# Patient Record
Sex: Male | Born: 1960 | Race: Black or African American | Hispanic: No | Smoking: Never smoker
Health system: Southern US, Community
[De-identification: ages and names within clinical notes are randomized; demographics above are authoritative.]

## PROBLEM LIST (undated history)

## (undated) DIAGNOSIS — R51 Headache: Secondary | ICD-10-CM

## (undated) DIAGNOSIS — F329 Major depressive disorder, single episode, unspecified: Secondary | ICD-10-CM

## (undated) DIAGNOSIS — K219 Gastro-esophageal reflux disease without esophagitis: Secondary | ICD-10-CM

## (undated) DIAGNOSIS — M1990 Unspecified osteoarthritis, unspecified site: Secondary | ICD-10-CM

## (undated) DIAGNOSIS — F32A Depression, unspecified: Secondary | ICD-10-CM

## (undated) HISTORY — PX: COLONOSCOPY W/ BIOPSIES AND POLYPECTOMY: SHX1376

## (undated) HISTORY — PX: KNEE ARTHROSCOPY: SUR90

## (undated) HISTORY — PX: HERNIA REPAIR: SHX51

---

## 2014-05-02 ENCOUNTER — Encounter (HOSPITAL_COMMUNITY): Payer: Self-pay | Admitting: Pharmacy Technician

## 2014-05-08 ENCOUNTER — Other Ambulatory Visit: Payer: Self-pay | Admitting: Orthopedic Surgery

## 2014-05-09 ENCOUNTER — Encounter (HOSPITAL_COMMUNITY)
Admission: RE | Admit: 2014-05-09 | Discharge: 2014-05-09 | Disposition: A | Discharge status: Home / Self Care | Payer: Medicare Other | Source: Ambulatory Visit | Attending: Orthopedic Surgery | Admitting: Orthopedic Surgery

## 2014-05-09 ENCOUNTER — Encounter (HOSPITAL_COMMUNITY): Payer: Self-pay

## 2014-05-09 DIAGNOSIS — M1710 Unilateral primary osteoarthritis, unspecified knee: Secondary | ICD-10-CM | POA: Insufficient documentation

## 2014-05-09 DIAGNOSIS — Z01812 Encounter for preprocedural laboratory examination: Secondary | ICD-10-CM | POA: Diagnosis not present

## 2014-05-09 DIAGNOSIS — Z7982 Long term (current) use of aspirin: Secondary | ICD-10-CM | POA: Insufficient documentation

## 2014-05-09 DIAGNOSIS — Z01810 Encounter for preprocedural cardiovascular examination: Secondary | ICD-10-CM | POA: Diagnosis not present

## 2014-05-09 HISTORY — DX: Major depressive disorder, single episode, unspecified: F32.9

## 2014-05-09 HISTORY — DX: Depression, unspecified: F32.A

## 2014-05-09 HISTORY — DX: Headache: R51

## 2014-05-09 HISTORY — DX: Gastro-esophageal reflux disease without esophagitis: K21.9

## 2014-05-09 HISTORY — DX: Unspecified osteoarthritis, unspecified site: M19.90

## 2014-05-09 LAB — COMPREHENSIVE METABOLIC PANEL
ALT: 13 U/L (ref 0–53)
ANION GAP: 11 (ref 5–15)
AST: 21 U/L (ref 0–37)
Albumin: 4.1 g/dL (ref 3.5–5.2)
Alkaline Phosphatase: 69 U/L (ref 39–117)
BUN: 12 mg/dL (ref 6–23)
CO2: 25 meq/L (ref 19–32)
Calcium: 9.2 mg/dL (ref 8.4–10.5)
Chloride: 104 mEq/L (ref 96–112)
Creatinine, Ser: 0.99 mg/dL (ref 0.50–1.35)
GLUCOSE: 100 mg/dL — AB (ref 70–99)
Potassium: 4.3 mEq/L (ref 3.7–5.3)
SODIUM: 140 meq/L (ref 137–147)
Total Bilirubin: 0.8 mg/dL (ref 0.3–1.2)
Total Protein: 7.3 g/dL (ref 6.0–8.3)

## 2014-05-09 LAB — URINALYSIS, ROUTINE W REFLEX MICROSCOPIC
Bilirubin Urine: NEGATIVE
GLUCOSE, UA: NEGATIVE mg/dL
Hgb urine dipstick: NEGATIVE
KETONES UR: NEGATIVE mg/dL
LEUKOCYTES UA: NEGATIVE
Nitrite: NEGATIVE
PH: 5.5 (ref 5.0–8.0)
Protein, ur: NEGATIVE mg/dL
Specific Gravity, Urine: 1.02 (ref 1.005–1.030)
Urobilinogen, UA: 1 mg/dL (ref 0.0–1.0)

## 2014-05-09 LAB — CBC WITH DIFFERENTIAL/PLATELET
Basophils Absolute: 0 10*3/uL (ref 0.0–0.1)
Basophils Relative: 1 % (ref 0–1)
Eosinophils Absolute: 0 10*3/uL (ref 0.0–0.7)
Eosinophils Relative: 1 % (ref 0–5)
HEMATOCRIT: 39.2 % (ref 39.0–52.0)
HEMOGLOBIN: 13.4 g/dL (ref 13.0–17.0)
LYMPHS PCT: 33 % (ref 12–46)
Lymphs Abs: 0.9 10*3/uL (ref 0.7–4.0)
MCH: 28.1 pg (ref 26.0–34.0)
MCHC: 34.2 g/dL (ref 30.0–36.0)
MCV: 82.2 fL (ref 78.0–100.0)
MONO ABS: 0.4 10*3/uL (ref 0.1–1.0)
Monocytes Relative: 15 % — ABNORMAL HIGH (ref 3–12)
Neutro Abs: 1.3 10*3/uL — ABNORMAL LOW (ref 1.7–7.7)
Neutrophils Relative %: 50 % (ref 43–77)
PLATELETS: 220 10*3/uL (ref 150–400)
RBC: 4.77 MIL/uL (ref 4.22–5.81)
RDW: 12.7 % (ref 11.5–15.5)
WBC: 2.6 10*3/uL — AB (ref 4.0–10.5)

## 2014-05-09 LAB — TYPE AND SCREEN
ABO/RH(D): O POS
Antibody Screen: NEGATIVE

## 2014-05-09 LAB — PROTIME-INR
INR: 1 (ref 0.00–1.49)
Prothrombin Time: 13.2 seconds (ref 11.6–15.2)

## 2014-05-09 LAB — APTT: aPTT: 32 seconds (ref 24–37)

## 2014-05-09 LAB — SURGICAL PCR SCREEN
MRSA, PCR: NEGATIVE
Staphylococcus aureus: NEGATIVE

## 2014-05-09 LAB — ABO/RH: ABO/RH(D): O POS

## 2014-05-09 NOTE — Progress Notes (Signed)
Pt denies SOB, chest pain, and being under the care of a cardiologist. Pt denies having an EKG and chest x ray within the last year. Pt stated that he never had a stress test, and cardiac cath but stated that he may have had an echo in [place] and his records were sent to the [facility]; records requested. Pt chart forwarded to [place], [place] ( anesthesia) to review abnormal EKG and WBC count.

## 2014-05-10 NOTE — Progress Notes (Addendum)
Anesthesia Chart Review:  Patient is a 53 year old male scheduled for left TKR on 05/14/14 by Dr. Luiz Blare.  History includes non-smoker, GERD, depression, headaches, DJD, hernia repair X 3, left knee arthroscopy. No PCP is listed. BMI 27.05. VSS at PAT, afebrile. Does receive care at [facility] in [place] and [place].  Meds: Imitrex, naproxen, nortriptyline, tramadol, Vitamin C, Systane Ultra.   EKG on 05/09/14 showed: NSR, minimal voltage criteria for LVH, septal infarct (age undetermined). Currently, there are no comparison EKGs available.  Records still pending from the [place]. He may have had a prior echo in [place], but nothing real recent.  CXR on 05/09/14 showed: No abnormality noted.  Preoperative labs noted. WBC 2.6, absolute neutrophils 1.3 consistent with mild neutropenia.  Currently, no comparison labs are available. I have routed labs to Dr. Luiz Blare and left a voice message with Albin Felling at his office regarding CBC results.  I also reviewed EKG and CBC with anesthesiologist Dr. Krista Blue.  Patient afebrile, CXR unremarkable, so unless any worrisome s/s of infection would defer additional recommendations, if any, to Dr. Luiz Blare.  In regards to his EKG, will await to see if any additional records from the [place].  If none, I'll attempt to call to clinically correlate--however, he did deny SOB and CP and his PAT visit yesterday.  If labs are felt acceptable by Dr. Luiz Blare and he remains asymptomatic from a CV standpoint then it is anticipated that he can proceed as planned. (Update 05/11/14: I was told by Albin Felling at Dr. Luiz Blare office around 3:30 PM that case was canceled because it was not authorized yet, so I did not attempt to contact patient today.  Records also re-requested from the [facility] but never came.  I was told after 6PM that case was reposted.  He will be further evaluated by his assigned anesthesiologist on the day of surgery.)   Velna Ochs [facility] Short Stay Center/Anesthesiology Phone [phone] 05/10/2014 4:26 PM

## 2014-05-11 NOTE — Progress Notes (Signed)
I spoke to patient, he is aware that he needs to arrive at 1230 on Monday for surgery.

## 2014-05-13 MED ORDER — CEFAZOLIN SODIUM-DEXTROSE 2-3 GM-% IV SOLR
2.0000 g | INTRAVENOUS | Status: AC
  Administered 2014-05-14: 2 g via INTRAVENOUS

## 2014-05-13 MED ORDER — CHLORHEXIDINE GLUCONATE 4 % EX LIQD
60.0000 mL | Freq: Once | CUTANEOUS | Status: DC
  Filled 2014-05-13: qty 60

## 2014-05-14 ENCOUNTER — Encounter (HOSPITAL_COMMUNITY): Admission: RE | Payer: Self-pay | Source: Ambulatory Visit

## 2014-05-14 ENCOUNTER — Inpatient Hospital Stay (HOSPITAL_COMMUNITY): Payer: Medicare Other | Admitting: Anesthesiology

## 2014-05-14 ENCOUNTER — Encounter (HOSPITAL_COMMUNITY): Payer: Medicare Other | Admitting: Vascular Surgery

## 2014-05-14 ENCOUNTER — Inpatient Hospital Stay (HOSPITAL_COMMUNITY)
Admission: RE | Admit: 2014-05-14 | Discharge: 2014-05-15 | DRG: 470 | Disposition: A | Discharge status: Home Health | Payer: Medicare Other | Source: Ambulatory Visit | Attending: Orthopedic Surgery | Admitting: Orthopedic Surgery

## 2014-05-14 ENCOUNTER — Encounter (HOSPITAL_COMMUNITY): Payer: Self-pay | Admitting: *Deleted

## 2014-05-14 ENCOUNTER — Inpatient Hospital Stay (HOSPITAL_COMMUNITY): Admission: RE | Admit: 2014-05-14 | Payer: Medicare Other | Source: Ambulatory Visit | Admitting: Orthopedic Surgery

## 2014-05-14 ENCOUNTER — Encounter (HOSPITAL_COMMUNITY)
Admission: RE | Disposition: A | Discharge status: Home Health | Payer: Self-pay | Source: Ambulatory Visit | Attending: Orthopedic Surgery

## 2014-05-14 DIAGNOSIS — M1710 Unilateral primary osteoarthritis, unspecified knee: Principal | ICD-10-CM | POA: Diagnosis present

## 2014-05-14 DIAGNOSIS — M1712 Unilateral primary osteoarthritis, left knee: Secondary | ICD-10-CM | POA: Diagnosis present

## 2014-05-14 DIAGNOSIS — F3289 Other specified depressive episodes: Secondary | ICD-10-CM | POA: Diagnosis present

## 2014-05-14 DIAGNOSIS — F329 Major depressive disorder, single episode, unspecified: Secondary | ICD-10-CM | POA: Diagnosis not present

## 2014-05-14 DIAGNOSIS — K219 Gastro-esophageal reflux disease without esophagitis: Secondary | ICD-10-CM | POA: Diagnosis present

## 2014-05-14 DIAGNOSIS — M25569 Pain in unspecified knee: Secondary | ICD-10-CM | POA: Diagnosis present

## 2014-05-14 HISTORY — PX: TOTAL KNEE ARTHROPLASTY: SHX125

## 2014-05-14 SURGERY — ARTHROPLASTY, KNEE, TOTAL
Anesthesia: General | Laterality: Left

## 2014-05-14 SURGERY — ARTHROPLASTY, KNEE, TOTAL
Anesthesia: General | Site: Knee | Laterality: Left

## 2014-05-14 MED ORDER — CEFUROXIME SODIUM 1.5 G IJ SOLR
INTRAMUSCULAR | Status: AC
  Filled 2014-05-14: qty 1.5

## 2014-05-14 MED ORDER — BUPIVACAINE LIPOSOME 1.3 % IJ SUSP
INTRAMUSCULAR | Status: DC | PRN
  Administered 2014-05-14: 20 mL

## 2014-05-14 MED ORDER — METHOCARBAMOL 750 MG PO TABS
750.0000 mg | ORAL_TABLET | Freq: Three times a day (TID) | ORAL | Status: AC | PRN
Start: 2014-05-14 — End: ?

## 2014-05-14 MED ORDER — FENTANYL CITRATE 0.05 MG/ML IJ SOLN
100.0000 ug | Freq: Once | INTRAMUSCULAR | Status: AC
  Administered 2014-05-14: 100 ug via INTRAVENOUS
  Filled 2014-05-14: qty 2

## 2014-05-14 MED ORDER — ONDANSETRON HCL 4 MG PO TABS: 4.0000 mg | ORAL_TABLET | Freq: Four times a day (QID) | ORAL | Status: DC | PRN

## 2014-05-14 MED ORDER — SODIUM CHLORIDE 0.9 % IR SOLN
Status: DC | PRN
  Administered 2014-05-14: 2000 mL

## 2014-05-14 MED ORDER — INFLUENZA VAC SPLIT QUAD 0.5 ML IM SUSY
0.5000 mL | PREFILLED_SYRINGE | INTRAMUSCULAR | Status: AC
Start: 2014-05-15 — End: 2014-05-15
  Administered 2014-05-15: 0.5 mL via INTRAMUSCULAR
  Filled 2014-05-14: qty 0.5

## 2014-05-14 MED ORDER — ONDANSETRON HCL 4 MG/2ML IJ SOLN: 4.0000 mg | Freq: Four times a day (QID) | INTRAMUSCULAR | Status: DC | PRN

## 2014-05-14 MED ORDER — ACETAMINOPHEN 325 MG PO TABS: 650.0000 mg | ORAL_TABLET | Freq: Four times a day (QID) | ORAL | Status: DC | PRN

## 2014-05-14 MED ORDER — POLYETHYLENE GLYCOL 3350 17 G PO PACK: 17.0000 g | PACK | Freq: Every day | ORAL | Status: DC | PRN

## 2014-05-14 MED ORDER — ACETAMINOPHEN 650 MG RE SUPP: 650.0000 mg | Freq: Four times a day (QID) | RECTAL | Status: DC | PRN

## 2014-05-14 MED ORDER — KETOROLAC TROMETHAMINE 15 MG/ML IJ SOLN
15.0000 mg | Freq: Four times a day (QID) | INTRAMUSCULAR | Status: DC
  Administered 2014-05-14 – 2014-05-15 (×2): 15 mg via INTRAVENOUS
  Filled 2014-05-14 (×4): qty 1

## 2014-05-14 MED ORDER — LIDOCAINE HCL (CARDIAC) 20 MG/ML IV SOLN
INTRAVENOUS | Status: DC | PRN
  Administered 2014-05-14: 70 mg via INTRAVENOUS

## 2014-05-14 MED ORDER — FENTANYL CITRATE 0.05 MG/ML IJ SOLN
INTRAMUSCULAR | Status: AC
  Filled 2014-05-14: qty 5

## 2014-05-14 MED ORDER — NEOSTIGMINE METHYLSULFATE 10 MG/10ML IV SOLN
INTRAVENOUS | Status: DC | PRN
  Administered 2014-05-14: 3 mg via INTRAVENOUS

## 2014-05-14 MED ORDER — ASPIRIN EC 325 MG PO TBEC: 325.0000 mg | DELAYED_RELEASE_TABLET | Freq: Two times a day (BID) | ORAL | Status: AC

## 2014-05-14 MED ORDER — HYDROMORPHONE HCL PF 1 MG/ML IJ SOLN
INTRAMUSCULAR | Status: AC
  Filled 2014-05-14: qty 1

## 2014-05-14 MED ORDER — NORTRIPTYLINE HCL 25 MG PO CAPS
100.0000 mg | ORAL_CAPSULE | Freq: Two times a day (BID) | ORAL | Status: DC
  Administered 2014-05-15: 100 mg via ORAL
  Filled 2014-05-14 (×3): qty 4

## 2014-05-14 MED ORDER — SODIUM CHLORIDE 0.9 % IV SOLN
INTRAVENOUS | Status: DC
  Administered 2014-05-14: 100 mL/h via INTRAVENOUS

## 2014-05-14 MED ORDER — ASPIRIN EC 325 MG PO TBEC
325.0000 mg | DELAYED_RELEASE_TABLET | Freq: Two times a day (BID) | ORAL | Status: DC
  Administered 2014-05-15: 325 mg via ORAL
  Filled 2014-05-14 (×3): qty 1

## 2014-05-14 MED ORDER — PHENYLEPHRINE HCL 10 MG/ML IJ SOLN
INTRAMUSCULAR | Status: DC | PRN
  Administered 2014-05-14: 120 ug via INTRAVENOUS
  Administered 2014-05-14 (×3): 80 ug via INTRAVENOUS

## 2014-05-14 MED ORDER — HYDROMORPHONE HCL PF 1 MG/ML IJ SOLN: 1.0000 mg | INTRAMUSCULAR | Status: DC | PRN

## 2014-05-14 MED ORDER — LACTATED RINGERS IV SOLN
INTRAVENOUS | Status: DC
  Administered 2014-05-14 (×2): via INTRAVENOUS

## 2014-05-14 MED ORDER — HYDROMORPHONE HCL PF 1 MG/ML IJ SOLN
0.2500 mg | INTRAMUSCULAR | Status: DC | PRN
  Administered 2014-05-14 (×4): 0.5 mg via INTRAVENOUS

## 2014-05-14 MED ORDER — PROMETHAZINE HCL 25 MG/ML IJ SOLN: 12.5000 mg | Freq: Four times a day (QID) | INTRAMUSCULAR | Status: DC | PRN

## 2014-05-14 MED ORDER — OXYCODONE-ACETAMINOPHEN 5-325 MG PO TABS: 1.0000 | ORAL_TABLET | Freq: Four times a day (QID) | ORAL | Status: AC | PRN

## 2014-05-14 MED ORDER — BISACODYL 5 MG PO TBEC: 5.0000 mg | DELAYED_RELEASE_TABLET | Freq: Every day | ORAL | Status: DC | PRN

## 2014-05-14 MED ORDER — METHOCARBAMOL 500 MG PO TABS
500.0000 mg | ORAL_TABLET | Freq: Four times a day (QID) | ORAL | Status: DC | PRN
  Administered 2014-05-14 – 2014-05-15 (×2): 500 mg via ORAL
  Filled 2014-05-14 (×3): qty 1

## 2014-05-14 MED ORDER — GLYCOPYRROLATE 0.2 MG/ML IJ SOLN
INTRAMUSCULAR | Status: DC | PRN
  Administered 2014-05-14: .4 mg via INTRAVENOUS

## 2014-05-14 MED ORDER — TRANEXAMIC ACID 100 MG/ML IV SOLN
1000.0000 mg | INTRAVENOUS | Status: AC
  Administered 2014-05-14: 1000 mg via INTRAVENOUS
  Filled 2014-05-14 (×2): qty 10

## 2014-05-14 MED ORDER — DOCUSATE SODIUM 100 MG PO CAPS
100.0000 mg | ORAL_CAPSULE | Freq: Two times a day (BID) | ORAL | Status: DC
  Administered 2014-05-14 – 2014-05-15 (×2): 100 mg via ORAL
  Filled 2014-05-14 (×4): qty 1

## 2014-05-14 MED ORDER — EPHEDRINE SULFATE 50 MG/ML IJ SOLN
INTRAMUSCULAR | Status: DC | PRN
  Administered 2014-05-14: 10 mg via INTRAVENOUS

## 2014-05-14 MED ORDER — ZOLPIDEM TARTRATE 5 MG PO TABS: 5.0000 mg | ORAL_TABLET | Freq: Every evening | ORAL | Status: DC | PRN

## 2014-05-14 MED ORDER — METHOCARBAMOL 1000 MG/10ML IJ SOLN
500.0000 mg | Freq: Four times a day (QID) | INTRAVENOUS | Status: DC | PRN
  Filled 2014-05-14: qty 5

## 2014-05-14 MED ORDER — CEFAZOLIN SODIUM-DEXTROSE 2-3 GM-% IV SOLR
2.0000 g | Freq: Four times a day (QID) | INTRAVENOUS | Status: AC
  Administered 2014-05-14 – 2014-05-15 (×2): 2 g via INTRAVENOUS
  Filled 2014-05-14 (×2): qty 50

## 2014-05-14 MED ORDER — PROPOFOL 10 MG/ML IV BOLUS
INTRAVENOUS | Status: DC | PRN
  Administered 2014-05-14: 200 mg via INTRAVENOUS

## 2014-05-14 MED ORDER — DIPHENHYDRAMINE HCL 12.5 MG/5ML PO ELIX: 12.5000 mg | ORAL_SOLUTION | ORAL | Status: DC | PRN

## 2014-05-14 MED ORDER — BUPIVACAINE HCL (PF) 0.25 % IJ SOLN
INTRAMUSCULAR | Status: DC | PRN
  Administered 2014-05-14: 20 mL

## 2014-05-14 MED ORDER — FENTANYL CITRATE 0.05 MG/ML IJ SOLN
INTRAMUSCULAR | Status: DC | PRN
  Administered 2014-05-14: 100 ug via INTRAVENOUS
  Administered 2014-05-14: 150 ug via INTRAVENOUS

## 2014-05-14 MED ORDER — GLYCOPYRROLATE 0.2 MG/ML IJ SOLN
INTRAMUSCULAR | Status: AC
  Filled 2014-05-14: qty 4

## 2014-05-14 MED ORDER — TRANEXAMIC ACID 100 MG/ML IV SOLN
1000.0000 mg | INTRAVENOUS | Status: DC
  Filled 2014-05-14: qty 10

## 2014-05-14 MED ORDER — ONDANSETRON HCL 4 MG/2ML IJ SOLN
INTRAMUSCULAR | Status: DC | PRN
  Administered 2014-05-14: 4 mg via INTRAVENOUS

## 2014-05-14 MED ORDER — OXYCODONE-ACETAMINOPHEN 5-325 MG PO TABS
1.0000 | ORAL_TABLET | ORAL | Status: DC | PRN
  Administered 2014-05-14 – 2014-05-15 (×3): 2 via ORAL
  Filled 2014-05-14 (×3): qty 2

## 2014-05-14 MED ORDER — NEOSTIGMINE METHYLSULFATE 10 MG/10ML IV SOLN
INTRAVENOUS | Status: AC
  Filled 2014-05-14: qty 1

## 2014-05-14 MED ORDER — ROCURONIUM BROMIDE 100 MG/10ML IV SOLN
INTRAVENOUS | Status: DC | PRN
  Administered 2014-05-14: 4 mg via INTRAVENOUS
  Administered 2014-05-14: 40 mg via INTRAVENOUS

## 2014-05-14 MED ORDER — SUCCINYLCHOLINE CHLORIDE 20 MG/ML IJ SOLN
INTRAMUSCULAR | Status: DC | PRN
  Administered 2014-05-14: 100 mg via INTRAVENOUS

## 2014-05-14 MED ORDER — BUPIVACAINE LIPOSOME 1.3 % IJ SUSP
20.0000 mL | INTRAMUSCULAR | Status: DC
  Filled 2014-05-14: qty 20

## 2014-05-14 MED ORDER — ALUM & MAG HYDROXIDE-SIMETH 200-200-20 MG/5ML PO SUSP: 30.0000 mL | ORAL | Status: DC | PRN

## 2014-05-14 SURGICAL SUPPLY — 63 items
BANDAGE ESMARK 6X9 LF (GAUZE/BANDAGES/DRESSINGS) ×1 IMPLANT
BENZOIN TINCTURE PRP APPL 2/3 (GAUZE/BANDAGES/DRESSINGS) IMPLANT
BLADE SAGITTAL 25.0X1.19X90 (BLADE) ×2 IMPLANT
BLADE SAGITTAL 25.0X1.19X90MM (BLADE) ×1
BLADE SAW SAG 90X13X1.27 (BLADE) ×3 IMPLANT
BNDG COHESIVE 3X5 TAN STRL LF (GAUZE/BANDAGES/DRESSINGS) ×3 IMPLANT
BNDG ESMARK 6X9 LF (GAUZE/BANDAGES/DRESSINGS) ×3
BOWL SMART MIX CTS (DISPOSABLE) ×3 IMPLANT
CAPT RP KNEE ×3 IMPLANT
CEMENT HV SMART SET (Cement) ×6 IMPLANT
CLOSURE STERI-STRIP 1/2X4 (GAUZE/BANDAGES/DRESSINGS) ×1
CLOSURE WOUND 1/2 X4 (GAUZE/BANDAGES/DRESSINGS)
CLSR STERI-STRIP ANTIMIC 1/2X4 (GAUZE/BANDAGES/DRESSINGS) ×2 IMPLANT
COVER SURGICAL LIGHT HANDLE (MISCELLANEOUS) ×3 IMPLANT
CUFF TOURNIQUET SINGLE 34IN LL (TOURNIQUET CUFF) ×3 IMPLANT
CUFF TOURNIQUET SINGLE 44IN (TOURNIQUET CUFF) IMPLANT
DRAPE EXTREMITY T 121X128X90 (DRAPE) ×3 IMPLANT
DRAPE U-SHAPE 47X51 STRL (DRAPES) ×3 IMPLANT
DRSG MEPILEX BORDER 4X12 (GAUZE/BANDAGES/DRESSINGS) ×3 IMPLANT
DRSG PAD ABDOMINAL 8X10 ST (GAUZE/BANDAGES/DRESSINGS) IMPLANT
DURAPREP 26ML APPLICATOR (WOUND CARE) ×3 IMPLANT
ELECT REM PT RETURN 9FT ADLT (ELECTROSURGICAL) ×3
ELECTRODE REM PT RTRN 9FT ADLT (ELECTROSURGICAL) ×1 IMPLANT
EVACUATOR 1/8 PVC DRAIN (DRAIN) ×3 IMPLANT
FACESHIELD WRAPAROUND (MASK) ×3 IMPLANT
GAUZE SPONGE 4X4 12PLY STRL (GAUZE/BANDAGES/DRESSINGS) IMPLANT
GAUZE XEROFORM 5X9 LF (GAUZE/BANDAGES/DRESSINGS) IMPLANT
GLOVE BIOGEL PI IND STRL 8 (GLOVE) ×2 IMPLANT
GLOVE BIOGEL PI INDICATOR 8 (GLOVE) ×4
GLOVE ECLIPSE 7.5 STRL STRAW (GLOVE) ×6 IMPLANT
GOWN STRL REUS W/ TWL LRG LVL3 (GOWN DISPOSABLE) ×2 IMPLANT
GOWN STRL REUS W/ TWL XL LVL3 (GOWN DISPOSABLE) ×2 IMPLANT
GOWN STRL REUS W/TWL LRG LVL3 (GOWN DISPOSABLE) ×4
GOWN STRL REUS W/TWL XL LVL3 (GOWN DISPOSABLE) ×4
HANDPIECE INTERPULSE COAX TIP (DISPOSABLE) ×2
HOOD PEEL AWAY FACE SHEILD DIS (HOOD) ×6 IMPLANT
IMMOBILIZER KNEE 20 (SOFTGOODS) ×3 IMPLANT
IMMOBILIZER KNEE 22 UNIV (SOFTGOODS) ×3 IMPLANT
KIT BASIN OR (CUSTOM PROCEDURE TRAY) ×3 IMPLANT
KIT ROOM TURNOVER OR (KITS) ×3 IMPLANT
MANIFOLD NEPTUNE II (INSTRUMENTS) ×3 IMPLANT
NEEDLE HYPO 25GX1X1/2 BEV (NEEDLE) IMPLANT
NS IRRIG 1000ML POUR BTL (IV SOLUTION) ×3 IMPLANT
PACK TOTAL JOINT (CUSTOM PROCEDURE TRAY) ×3 IMPLANT
PAD ARMBOARD 7.5X6 YLW CONV (MISCELLANEOUS) ×6 IMPLANT
PAD CAST 4YDX4 CTTN HI CHSV (CAST SUPPLIES) ×1 IMPLANT
PADDING CAST COTTON 4X4 STRL (CAST SUPPLIES) ×2
SET HNDPC FAN SPRY TIP SCT (DISPOSABLE) ×1 IMPLANT
SPONGE GAUZE 4X4 12PLY STER LF (GAUZE/BANDAGES/DRESSINGS) ×3 IMPLANT
STAPLER VISISTAT 35W (STAPLE) IMPLANT
STRIP CLOSURE SKIN 1/2X4 (GAUZE/BANDAGES/DRESSINGS) IMPLANT
SUCTION FRAZIER TIP 10 FR DISP (SUCTIONS) ×3 IMPLANT
SUT MNCRL AB 3-0 PS2 18 (SUTURE) IMPLANT
SUT VIC AB 0 CTB1 27 (SUTURE) ×6 IMPLANT
SUT VIC AB 1 CT1 27 (SUTURE) ×4
SUT VIC AB 1 CT1 27XBRD ANBCTR (SUTURE) ×2 IMPLANT
SUT VIC AB 2-0 CTB1 (SUTURE) ×6 IMPLANT
SYR 50ML LL SCALE MARK (SYRINGE) ×3 IMPLANT
SYR CONTROL 10ML LL (SYRINGE) IMPLANT
TOWEL OR 17X24 6PK STRL BLUE (TOWEL DISPOSABLE) ×3 IMPLANT
TOWEL OR 17X26 10 PK STRL BLUE (TOWEL DISPOSABLE) ×3 IMPLANT
TRAY FOLEY CATH 16FRSI W/METER (SET/KITS/TRAYS/PACK) IMPLANT
WATER STERILE IRR 1000ML POUR (IV SOLUTION) ×3 IMPLANT

## 2014-05-14 NOTE — H&P (Signed)
TOTAL KNEE ADMISSION H&P  Patient is being admitted for left total knee arthroplasty.  Subjective:  Chief Complaint:left knee pain.  HPI: Robert Mcmillan, 53 y.o. male, has a history of pain and functional disability in the left knee due to arthritis and has failed non-surgical conservative treatments for greater than 12 weeks to includeNSAID's and/or analgesics, corticosteriod injections, viscosupplementation injections, weight reduction as appropriate and activity modification.  Onset of symptoms was gradual, starting 8 years ago with gradually worsening course since that time. The patient noted no past surgery on the left knee(s).  Patient currently rates pain in the left knee(s) at 8 out of 10 with activity. Patient has night pain, worsening of pain with activity and weight bearing, pain that interferes with activities of daily living, pain with passive range of motion, crepitus and joint swelling.  Patient has evidence of subchondral sclerosis, periarticular osteophytes and joint space narrowing by imaging studies. This patient has had failure of conservative care. There is no active infection.  There are no active problems to display for this patient.  Past Medical History  Diagnosis Date  . Headache(784.0)     migraines  . DJD (degenerative joint disease)   . Depression   . GERD (gastroesophageal reflux disease)     Past Surgical History  Procedure Laterality Date  . Knee arthroscopy      x 3 left knee  . Hernia repair      x 3   . Colonoscopy w/ biopsies and polypectomy      Prescriptions prior to admission  Medication Sig Dispense Refill  . Polyethyl Glycol-Propyl Glycol (SYSTANE ULTRA OP) Place 1 drop into both eyes daily as needed (for dry eyes).      . SUMAtriptan (IMITREX) 6 MG/0.5ML SOLN injection Inject 6 mg into the skin every 2 (two) hours as needed for migraine or headache. May repeat in 2 hours if headache persists or recurs.      . traMADol (ULTRAM) 50 MG tablet  Take 50 mg by mouth 3 (three) times daily as needed for moderate pain.      . naproxen (NAPROSYN) 500 MG tablet Take 500 mg by mouth 2 (two) times daily with a meal.      . nortriptyline (PAMELOR) 50 MG capsule Take 100 mg by mouth 2 (two) times daily.      Marland Kitchen OVER THE COUNTER MEDICATION Take 1 capsule by mouth every other day. Maximum Shred      . vitamin C (ASCORBIC ACID) 500 MG tablet Take 500 mg by mouth daily.       No Known Allergies  History  Substance Use Topics  . Smoking status: Never Smoker   . Smokeless tobacco: Never Used  . Alcohol Use: Not on file     Comment: social alcohol " a cooler every now and then"    Family History  Problem Relation Age of Onset  . Cancer Mother   . Diabetes Father   . Hypertension Other   . Diabetes Other   . Cancer Other      ROS ROS: I have reviewed the patient's review of systems thoroughly and there are no positive responses as relates to the HPI.   Objective:  Physical Exam  Vital signs in last 24 hours: Temp:  [99.1 F (37.3 C)] 99.1 F (37.3 C) (09/14 1249) Pulse Rate:  [61-80] 70 (09/14 1326) Resp:  [13-24] 13 (09/14 1326) BP: (122-154)/(66-86) 154/86 mmHg (09/14 1324) SpO2:  [100 %] 100 % (09/14 1326)  Weight:  [199 lb (90.266 kg)] 199 lb (90.266 kg) (09/14 1249) Well-developed well-nourished patient in no acute distress. Alert and oriented x3 HEENT:within normal limits Cardiac: Regular rate and rhythm Pulmonary: Lungs clear to auscultation Abdomen: Soft and nontender.  Normal active bowel sounds  Musculoskeletal: (l knee: painful rom// pain on standing//+med jt line tender// no instability Labs: Recent Results (from the past 2160 hour(s))  TYPE AND SCREEN     Status: None   Collection Time    05/09/14  9:50 AM      Result Value Ref Range   ABO/RH(D) O POS     Antibody Screen NEG     Sample Expiration 05/23/2014    ABO/RH     Status: None   Collection Time    05/09/14  9:50 AM      Result Value Ref Range    ABO/RH(D) O POS    SURGICAL PCR SCREEN     Status: None   Collection Time    05/09/14 10:45 AM      Result Value Ref Range   MRSA, PCR NEGATIVE  NEGATIVE   Staphylococcus aureus NEGATIVE  NEGATIVE   Comment:            The Xpert SA Assay (FDA     approved for NASAL specimens     in patients over 46 years of age),     is one component of     a comprehensive surveillance     program.  Test performance has     been validated by [employer] for patients greater     than or equal to 38 year old.     It is not intended     to diagnose infection nor to     guide or monitor treatment.  APTT     Status: None   Collection Time    05/09/14 10:46 AM      Result Value Ref Range   aPTT 32  24 - 37 seconds  CBC WITH DIFFERENTIAL     Status: Abnormal   Collection Time    05/09/14 10:46 AM      Result Value Ref Range   WBC 2.6 (*) 4.0 - 10.5 K/uL   RBC 4.77  4.22 - 5.81 MIL/uL   Hemoglobin 13.4  13.0 - 17.0 g/dL   HCT 39.2  39.0 - 52.0 %   MCV 82.2  78.0 - 100.0 fL   MCH 28.1  26.0 - 34.0 pg   MCHC 34.2  30.0 - 36.0 g/dL   RDW 12.7  11.5 - 15.5 %   Platelets 220  150 - 400 K/uL   Neutrophils Relative % 50  43 - 77 %   Neutro Abs 1.3 (*) 1.7 - 7.7 K/uL   Lymphocytes Relative 33  12 - 46 %   Lymphs Abs 0.9  0.7 - 4.0 K/uL   Monocytes Relative 15 (*) 3 - 12 %   Monocytes Absolute 0.4  0.1 - 1.0 K/uL   Eosinophils Relative 1  0 - 5 %   Eosinophils Absolute 0.0  0.0 - 0.7 K/uL   Basophils Relative 1  0 - 1 %   Basophils Absolute 0.0  0.0 - 0.1 K/uL  COMPREHENSIVE METABOLIC PANEL     Status: Abnormal   Collection Time    05/09/14 10:46 AM      Result Value Ref Range   Sodium 140  137 - 147 mEq/L   Potassium 4.3  3.7 - 5.3 mEq/L   Chloride 104  96 - 112 mEq/L   CO2 25  19 - 32 mEq/L   Glucose, Bld 100 (*) 70 - 99 mg/dL   BUN 12  6 - 23 mg/dL   Creatinine, Ser 0.99  0.50 - 1.35 mg/dL   Calcium 9.2  8.4 - 10.5 mg/dL   Total Protein 7.3  6.0 - 8.3 g/dL   Albumin 4.1  3.5 - 5.2  g/dL   AST 21  0 - 37 U/L   ALT 13  0 - 53 U/L   Alkaline Phosphatase 69  39 - 117 U/L   Total Bilirubin 0.8  0.3 - 1.2 mg/dL   GFR calc non Af Amer >90  >90 mL/min   GFR calc Af Amer >90  >90 mL/min   Comment: (NOTE)     The eGFR has been calculated using the CKD EPI equation.     This calculation has not been validated in all clinical situations.     eGFR's persistently <90 mL/min signify possible Chronic Kidney     Disease.   Anion gap 11  5 - 15  PROTIME-INR     Status: None   Collection Time    05/09/14 10:46 AM      Result Value Ref Range   Prothrombin Time 13.2  11.6 - 15.2 seconds   INR 1.00  0.00 - 1.49  URINALYSIS, ROUTINE W REFLEX MICROSCOPIC     Status: None   Collection Time    05/09/14 10:46 AM      Result Value Ref Range   Color, Urine YELLOW  YELLOW   APPearance CLEAR  CLEAR   Specific Gravity, Urine 1.020  1.005 - 1.030   pH 5.5  5.0 - 8.0   Glucose, UA NEGATIVE  NEGATIVE mg/dL   Hgb urine dipstick NEGATIVE  NEGATIVE   Bilirubin Urine NEGATIVE  NEGATIVE   Ketones, ur NEGATIVE  NEGATIVE mg/dL   Protein, ur NEGATIVE  NEGATIVE mg/dL   Urobilinogen, UA 1.0  0.0 - 1.0 mg/dL   Nitrite NEGATIVE  NEGATIVE   Leukocytes, UA NEGATIVE  NEGATIVE   Comment: MICROSCOPIC NOT DONE ON URINES WITH NEGATIVE PROTEIN, BLOOD, LEUKOCYTES, NITRITE, OR GLUCOSE <1000 mg/dL.    Estimated body mass index is 26.98 kg/(m^2) as calculated from the following:   Height as of this encounter: 6' (1.829 m).   Weight as of this encounter: 199 lb (90.266 kg).   Imaging Review Plain radiographs demonstrate moderate degenerative joint disease of the left knee(s). The overall alignment ismild varus. The bone quality appears to be good for age and reported activity level.  Assessment/Plan:  End stage arthritis, left knee   The patient history, physical examination, clinical judgment of the provider and imaging studies are consistent with end stage degenerative joint disease of the left  knee(s) and total knee arthroplasty is deemed medically necessary. The treatment options including medical management, injection therapy arthroscopy and arthroplasty were discussed at length. The risks and benefits of total knee arthroplasty were presented and reviewed. The risks due to aseptic loosening, infection, stiffness, patella tracking problems, thromboembolic complications and other imponderables were discussed. The patient acknowledged the explanation, agreed to proceed with the plan and consent was signed. Patient is being admitted for inpatient treatment for surgery, pain control, PT, OT, prophylactic antibiotics, VTE prophylaxis, progressive ambulation and ADL's and discharge planning. The patient is planning to be discharged home with home health services

## 2014-05-14 NOTE — Transfer of Care (Signed)
Immediate Anesthesia Transfer of Care Note  Patient: Robert Mcmillan  Procedure(s) Performed: Procedure(s) with comments: TOTAL KNEE ARTHROPLASTY (Left) - Left total knee arthroplasty  Patient Location: PACU  Anesthesia Type:GA combined with regional for post-op pain  Level of Consciousness: awake, alert  and oriented  Airway & Oxygen Therapy: Patient Spontanous Breathing and Patient connected to nasal cannula oxygen  Post-op Assessment: Report given to PACU RN and Post -op Vital signs reviewed and stable  Post vital signs: Reviewed and stable  Complications: No apparent anesthesia complications

## 2014-05-14 NOTE — Discharge Instructions (Signed)
Total Knee Replacement, Care After °Refer to this sheet in the next few weeks. These instructions provide you with information on caring for yourself after your procedure. Your health care provider also may give you specific instructions. Your treatment has been planned according to the most current medical practices, but problems sometimes occur. Call your health care provider if you have any problems or questions after your procedure. °HOME CARE INSTRUCTIONS  °· See a physical therapist as directed by your health care provider. °· Take medicines only as directed by your health care provider. °· Avoid lifting or driving until you are instructed otherwise. °· If you have been sent home with a continuous passive motion machine, use it as directed by your health care provider. °SEEK MEDICAL CARE IF: °· You have difficulty breathing. °· You have drainage, redness, swelling, or pain at your incision site. °· You have a bad smell coming from your incision site. °· You have persistent bleeding from your incision site. °· Your incision breaks open after sutures (stitches) or staples have been removed. °· You have a fever. °SEEK IMMEDIATE MEDICAL CARE IF:  °· You have a rash. °· You have pain or swelling in your calf or thigh. °· You have shortness of breath or chest pain. °· Your range of motion in your knee is decreasing rather than increasing. °MAKE SURE YOU:  °· Understand these instructions. °· Will watch your condition. °· Will get help right away if you are not doing well or get worse. °Document Released: 03/06/2005 Document Revised: 01/01/2014 Document Reviewed: 10/06/2011 °ExitCare® Patient Information ©2015 [employer]. This information is not intended to replace advice given to you by your health care provider. Make sure you discuss any questions you have with your health care provider. ° °

## 2014-05-14 NOTE — Anesthesia Procedure Notes (Addendum)
Performed by: Marena Chancy   Anesthesia Regional Block:  Femoral nerve block  Pre-Anesthetic Checklist: ,, timeout performed, Correct Patient, Correct Site, Correct Laterality, Correct Procedure, Correct Position, site marked, Risks and benefits discussed,  Surgical consent,  Pre-op evaluation,  At surgeon's request and post-op pain management  Laterality: Left  Prep: Maximum Sterile Barrier Precautions used, chloraprep and alcohol swabs       Needles:  Injection technique: Single-shot  Needle Type: Stimulator Needle - 80        Needle insertion depth: 5 cm   Additional Needles:  Procedures: nerve stimulator Femoral nerve block  Nerve Stimulator or Paresthesia:  Response: 0.5 mA, 0.1 ms, 5 cm  Additional Responses:   Narrative:  Start time: 05/14/2014 1:30 PM End time: 05/14/2014 1:35 PM Injection made incrementally with aspirations every 5 mL.  Performed by: Personally  Anesthesiologist: Maren Beach MD  Additional Notes: Pt accepts procedure w/ risks. 20 cc 0.5% Marcaine w/ epi w/o difficulty or discomfort. GES

## 2014-05-14 NOTE — Brief Op Note (Signed)
05/14/2014  3:57 PM  PATIENT:  Robert Mcmillan  53 y.o. male  PRE-OPERATIVE DIAGNOSIS:  DJD L knee  POST-OPERATIVE DIAGNOSIS:  DJD L knee  PROCEDURE:  Procedure(s) with comments: TOTAL KNEE ARTHROPLASTY (Left) - Left total knee arthroplasty  SURGEON:  Surgeon(s) and Role:    * Harvie Junior, MD - Primary  PHYSICIAN ASSISTANT:   ASSISTANTS: bethune   ANESTHESIA:   general  EBL:  Total I/O In: 1000 [I.V.:1000] Out: -   BLOOD ADMINISTERED:none  DRAINS: (1) Hemovact drain(s) in the l knee with  Suction Open   LOCAL MEDICATIONS USED:  OTHER experel  SPECIMEN:  No Specimen  DISPOSITION OF SPECIMEN:  N/A  COUNTS:  YES  TOURNIQUET:  * Missing tourniquet times found for documented tourniquets in log:  [id number] *  DICTATION: .Other Dictation: Dictation Number [id number]  PLAN OF CARE: Admit to inpatient   PATIENT DISPOSITION:  PACU - hemodynamically stable.   Delay start of Pharmacological VTE agent (>24hrs) due to surgical blood loss or risk of bleeding: no

## 2014-05-14 NOTE — Progress Notes (Signed)
Orthopedic Tech Progress Note Patient Details:  Robert Mcmillan Nov 15, 1960 [id number]  CPM Left Knee CPM Left Knee: On Left Knee Flexion (Degrees): 90 Left Knee Extension (Degrees): 0 Additional Comments: applied overhead frame to bed and footsie roll   Jennye Moccasin 05/14/2014, 6:15 PM

## 2014-05-14 NOTE — Anesthesia Preprocedure Evaluation (Addendum)
Anesthesia Evaluation  Patient identified by MRN, date of birth, ID band Patient awake    Reviewed: Allergy & Precautions, H&P , NPO status , Patient's Chart, lab work & pertinent test results  Airway Mallampati: II      Dental  (+) Teeth Intact, Poor Dentition   Pulmonary          Cardiovascular Rhythm:Regular     Neuro/Psych  Headaches, PSYCHIATRIC DISORDERS Depression    GI/Hepatic GERD-  Controlled,  Endo/Other    Renal/GU      Musculoskeletal   Abdominal   Peds  Hematology   Anesthesia Other Findings   Reproductive/Obstetrics                          Anesthesia Physical Anesthesia Plan  ASA: II  Anesthesia Plan: General   Post-op Pain Management:    Induction: Intravenous  Airway Management Planned: Oral ETT  Additional Equipment:   Intra-op Plan:   Post-operative Plan: Extubation in OR  Informed Consent: I have reviewed the patients History and Physical, chart, labs and discussed the procedure including the risks, benefits and alternatives for the proposed anesthesia with the patient or authorized representative who has indicated his/her understanding and acceptance.   Dental advisory given  Plan Discussed with: CRNA, Anesthesiologist and Surgeon  Anesthesia Plan Comments:         Anesthesia Quick Evaluation

## 2014-05-15 ENCOUNTER — Encounter (HOSPITAL_COMMUNITY): Payer: Self-pay | Admitting: General Practice

## 2014-05-15 DIAGNOSIS — M1710 Unilateral primary osteoarthritis, unspecified knee: Secondary | ICD-10-CM | POA: Diagnosis not present

## 2014-05-15 HISTORY — PX: TOTAL KNEE ARTHROPLASTY: SHX125

## 2014-05-15 LAB — BASIC METABOLIC PANEL
Anion gap: 11 (ref 5–15)
BUN: 14 mg/dL (ref 6–23)
CALCIUM: 8.1 mg/dL — AB (ref 8.4–10.5)
CHLORIDE: 104 meq/L (ref 96–112)
CO2: 26 meq/L (ref 19–32)
Creatinine, Ser: 1.14 mg/dL (ref 0.50–1.35)
GFR calc Af Amer: 82 mL/min — ABNORMAL LOW (ref >90)
GFR calc non Af Amer: 71 mL/min — ABNORMAL LOW (ref >90)
Glucose, Bld: 109 mg/dL — ABNORMAL HIGH (ref 70–99)
Potassium: 4 mEq/L (ref 3.7–5.3)
SODIUM: 141 meq/L (ref 137–147)

## 2014-05-15 LAB — CBC
HCT: 28.6 % — ABNORMAL LOW (ref 39.0–52.0)
HEMOGLOBIN: 9.8 g/dL — AB (ref 13.0–17.0)
MCH: 28.1 pg (ref 26.0–34.0)
MCHC: 34.3 g/dL (ref 30.0–36.0)
MCV: 81.9 fL (ref 78.0–100.0)
PLATELETS: 162 10*3/uL (ref 150–400)
RBC: 3.49 MIL/uL — AB (ref 4.22–5.81)
RDW: 13.1 % (ref 11.5–15.5)
WBC: 4.7 10*3/uL (ref 4.0–10.5)

## 2014-05-15 NOTE — Progress Notes (Signed)
CARE MANAGEMENT NOTE 05/15/2014  Patient:  [facility]   Account Number:  [id number]  Date Initiated:  05/15/2014  Documentation initiated by:  [facility]  Subjective/Objective Assessment:   admitted s/p left TKA     Action/Plan:   PT/OT evals-recommended HHPT   Anticipated DC Date:  05/16/2014   Anticipated DC Plan:  HOME W HOME HEALTH SERVICES      DC Planning Services  CM consult      [facility] Choice  DURABLE MEDICAL EQUIPMENT  HOME HEALTH   Choice offered to / List presented to:  C-1 Patient   DME arranged  3-N-1  WALKER - ROLLING      DME agency  [employer]     HH arranged  HH-2 PT      HH agency  [facility]   Status of service:  Completed, signed off Medicare Important Message given?  NA - LOS <3 / Initial given by admissions (If response is "NO", the following Medicare IM given date fields will be blank) Date Medicare IM given:   Medicare IM given by:   Date Additional Medicare IM given:   Additional Medicare IM given by:    Discharge Disposition:  HOME W HOME HEALTH SERVICES  Per UR Regulation:    If discussed at Long Length of Stay Meetings, dates discussed:    Comments:  05/15/14 Spoke with patient about HHC, he chose [facility] from the [employer] of [facility] agencies. Contacted Ingram Onnen with [facility] and set up HHPT. [employer] to bring rolling walker and 3N1 to patient today and deliver CPM to patient's home.Jacquelynn Cree RN, BSN, CCM

## 2014-05-15 NOTE — Op Note (Signed)
NAMEMarland Kitchen  Mcmillan, Robert NO.:  [id number]  MEDICAL RECORD NO.:  [id number]  LOCATION:  5N03C                        FACILITY:  [facility]  PHYSICIAN:  Harvie Junior, M.D.   DATE OF BIRTH:  27-Jul-1961  DATE OF PROCEDURE:  05/14/2014 DATE OF DISCHARGE:                              OPERATIVE REPORT   PREOPERATIVE DIAGNOSIS:  End-stage degenerative joint disease, left knee.  POSTOPERATIVE DIAGNOSIS:  End-stage degenerative joint disease, left knee.  PRINCIPAL PROCEDURE:  Left total knee replacement with Sigma system, size 5 femur, size 5 tibia, 10-mm bridging bearing, and a 41-mm all polyethylene patella.  SURGEON:  Harvie Junior, M.D.  ASSISTANT:  Marshia Ly, P.A.  ANESTHESIA:  General.  BRIEF HISTORY:  Robert Mcmillan is a 53 year old male with history of having significant complaints of bilateral knee pain, left much greater than right.  He had been treated at the [facility] for prolonged period of time and he was sent out of the [place] [facility] into our office for evaluation.  He had had injection therapy, activity modification, physical therapy, was having significant night pain, light activity pain.  After failure of all conservative care, he was taken to the operating room for left total knee replacement.  DESCRIPTION OF PROCEDURE:  The patient was taken to the operating room. After adequate anesthesia was obtained with general anesthetic, the patient was placed supine on the operating table.  Left leg was prepped and draped in usual sterile fashion.  Following this, the leg was exsanguinated.  Blood pressure tourniquet inflated to 350 mmHg. Following this, midline incision was made in the subcutaneous tissue, dissected down to the level of the extensor mechanism where a medial parapatellar arthrotomy was undertaken.  Once this was done, the synovium in the anterior aspect of the femur, medial and lateral meniscus, retropatellar fat pad, and the anterior and  posterior cruciates were excised.  The tibia was then cut perpendicular to its long axis with an extramedullary guide.  Intramedullary pilot hole was drilled in the femur and the femur was cut with a 5-degree give valgus inclination relative to the long axis.  Once this was done, the spacer blocks were put in place.  Attention was then turned to the femur, sized to a 5.  Anterior and posterior cuts were made, chamfers and box. Attention was then turned towards the tibia where a rotational alignment was set and then the tibia was drilled and keeled and size 5 trials were placed.  The 10-mm bridging bearing trial was placed.  Attention was turned to the patella where it was cut down to the level of 13 mm and a 41 trial was chosen and fit nicely.  Once this was done, all trial components were removed.  The knee was copiously and thoroughly lavaged, suctioned dry.  The final components were then cemented in place, size 5 femur, size 7 tibia, 10-mm bridging bearing trial, and a 41 mm all poly patella were placed and held in place with a clamp to the patella and in full extension in terms of the knee.  Once this was done, all excess bone cement was removed and attention was turned back to the knee where  the final 10-mm polyethylene was placed.  Excellent range of motion and stability were achieved at this point.  The wound was copiously and thoroughly lavaged, suctioned dry, and the medium Hemovac drain was placed at this point.  The medial parapatellar arthrotomy was closed with a 1 Vicryl, skin with 0 and 2-0 Vicryl, and 3-0 Monocryl subcuticular. Benzoin and Steri-Strips were applied.  Sterile compressive dressing was applied.  The patient was taken to the recovery room where he was noted to be in satisfactory condition.  Estimated blood loss for the procedure was none.     Harvie Junior, M.D.     Ranae Plumber  D:  05/14/2014  T:  05/15/2014  Job:  [id number]

## 2014-05-15 NOTE — Progress Notes (Signed)
Physical Therapy Treatment Patient Details Name: Savien Mamula MRN: [id number] DOB: [birth year] Today's Date: 05/15/2014    History of Present Illness pt presents with L TKA.      PT Comments    Pt very drowsy this pm, but continues to mobilize very well.  Pt stating he wants to D/C to home this pm and feel pt is ready for D/C from PT stand point.  Reviewed car transfer with pt and visitor.  Will continue to follow if remains on acute.    Follow Up Recommendations  Home health PT;Supervision - Intermittent     Equipment Recommendations  Rolling walker with 5" wheels;3in1 (PT)    Recommendations for Other Services       Precautions / Restrictions Precautions Precautions: None Restrictions Weight Bearing Restrictions: Yes LLE Weight Bearing: Weight bearing as tolerated    Mobility  Bed Mobility Overal bed mobility: Modified Independent                Transfers Overall transfer level: Needs assistance Equipment used: Rolling walker (2 wheeled) Transfers: Sit to/from Stand Sit to Stand: Supervision         General transfer comment: demos good technique.  S 2/2 pt very drowsy.    Ambulation/Gait Ambulation/Gait assistance: Supervision Ambulation Distance (Feet): 350 Feet Assistive device: Rolling walker (2 wheeled) Gait Pattern/deviations: Step-through pattern;Decreased stride length     General Gait Details: pt with improved posture and demos good return on cueing from this am.     Stairs            Wheelchair Mobility    Modified Rankin (Stroke Patients Only)       Balance Overall balance assessment: No apparent balance deficits (not formally assessed)                                  Cognition Arousal/Alertness: Awake/alert (Drowsy, but arousable.) Behavior During Therapy: WFL for tasks assessed/performed Overall Cognitive Status: Within Functional Limits for tasks assessed                      Exercises       General Comments        Pertinent Vitals/Pain Pain Assessment: 0-10 Pain Score: 2  Pain Location: L knee Pain Descriptors / Indicators: Tightness Pain Intervention(s): Premedicated before session;Repositioned    Home Living                      Prior Function            PT Goals (current goals can now be found in the care plan section) Acute Rehab PT Goals Patient Stated Goal: Walk better PT Goal Formulation: With patient Time For Goal Achievement: 05/22/14 Potential to Achieve Goals: Good Progress towards PT goals: Progressing toward goals    Frequency  7X/week    PT Plan Current plan remains appropriate    Co-evaluation             End of Session Equipment Utilized During Treatment: Gait belt Activity Tolerance: Patient tolerated treatment well Patient left: in bed;with call bell/phone within reach;with family/visitor present     Time: 1324-1350 PT Time Calculation (min): 26 min  Charges:  $Gait Training: 8-22 mins                    G Codes:      Emonnie Cannady, Alison Murray, PT  [phone] 05/15/2014, 2:07 PM

## 2014-05-15 NOTE — Discharge Summary (Signed)
Patient ID: Robert Mcmillan MRN: [id number] DOB/AGE: 1961-08-27 53 y.o.  Admit date: 05/14/2014 Discharge date: 05/15/2014  Admission Diagnoses:  Principal Problem:   Osteoarthritis of left knee   Discharge Diagnoses:  Same  Past Medical History  Diagnosis Date  . Headache(784.0)     migraines  . DJD (degenerative joint disease)   . Depression   . GERD (gastroesophageal reflux disease)     Surgeries: Procedure(s):left TOTAL KNEE ARTHROPLASTY on 05/14/2014      Discharged Condition: Improved  Hospital Course: Robert Mcmillan is an 53 y.o. male who was admitted 05/14/2014 for operative treatment ofOsteoarthritis of left knee. Patient has severe unremitting pain that affects sleep, daily activities, and work/hobbies. After pre-op clearance the patient was taken to the operating room on 05/14/2014 and underwent  Procedure(s):left TOTAL KNEE ARTHROPLASTY.    Patient was given perioperative antibiotics: Anti-infectives   Start     Dose/Rate Route Frequency Ordered Stop   05/14/14 2200  ceFAZolin (ANCEF) IVPB 2 g/50 mL premix     2 g 100 mL/hr over 30 Minutes Intravenous Every 6 hours 05/14/14 2016 05/15/14 0448   05/14/14 0600  ceFAZolin (ANCEF) IVPB 2 g/50 mL premix     2 g 100 mL/hr over 30 Minutes Intravenous On call to O.R. 05/13/14 1332 05/14/14 1412       Patient was given sequential compression devices, early ambulation, and chemoprophylaxis to prevent DVT. His drain was pulled on the morning of discharge.  His  Left knee dressing was changed prior to discharge.  His wound was benign.  Patient benefited maximally from hospital stay and there were no complications.    Recent vital signs: Patient Vitals for the past 24 hrs:  BP Temp Temp src Pulse Resp SpO2  05/15/14 0415 114/51 mmHg 98.5 F (36.9 C) Oral 99 16 99 %  05/15/14 0043 107/57 mmHg 99.3 F (37.4 C) - 86 14 98 %  05/14/14 2010 131/69 mmHg 98.2 F (36.8 C) - 91 16 94 %  05/14/14 1915 - - - 87 16 100 %   05/14/14 1901 - - - 76 14 100 %  05/14/14 1900 144/74 mmHg 98.6 F (37 C) - 89 15 99 %  05/14/14 1832 128/68 mmHg - - 87 14 100 %  05/14/14 1802 144/60 mmHg - - 90 15 100 %  05/14/14 1800 - 98.3 F (36.8 C) - - - -  05/14/14 1747 146/71 mmHg - - 74 12 100 %  05/14/14 1732 147/61 mmHg - - 68 14 100 %  05/14/14 1730 - - - 70 13 100 %  05/14/14 1717 148/65 mmHg - - 69 14 100 %  05/14/14 1715 - - - 67 15 100 %  05/14/14 1702 150/73 mmHg - - 67 14 100 %  05/14/14 1700 - - - 80 13 100 %  05/14/14 1648 141/67 mmHg - - 76 18 100 %  05/14/14 1645 - - - 82 16 100 %  05/14/14 1633 - - - 85 21 98 %  05/14/14 1630 - 97.7 F (36.5 C) - 90 - 90 %     Recent laboratory studies:  Recent Labs  05/15/14 0437  WBC 4.7  HGB 9.8*  HCT 28.6*  PLT 162  NA 141  K 4.0  CL 104  CO2 26  BUN 14  CREATININE 1.14  GLUCOSE 109*  CALCIUM 8.1*     Discharge Medications:     Medication List    STOP taking these medications  naproxen 500 MG tablet  Commonly known as:  NAPROSYN      TAKE these medications       aspirin EC 325 MG tablet  Take 1 tablet (325 mg total) by mouth 2 (two) times daily after a meal.     methocarbamol 750 MG tablet  Commonly known as:  ROBAXIN-750  Take 1 tablet (750 mg total) by mouth every 8 (eight) hours as needed for muscle spasms.     nortriptyline 50 MG capsule  Commonly known as:  PAMELOR  Take 100 mg by mouth 2 (two) times daily.     OVER THE COUNTER MEDICATION  Take 1 capsule by mouth every other day. Maximum Shred     oxyCODONE-acetaminophen 5-325 MG per tablet  Commonly known as:  PERCOCET/ROXICET  Take 1-2 tablets by mouth every 6 (six) hours as needed for severe pain.     SUMAtriptan 6 MG/0.5ML Soln injection  Commonly known as:  IMITREX  Inject 6 mg into the skin every 2 (two) hours as needed for migraine or headache. May repeat in 2 hours if headache persists or recurs.     SYSTANE ULTRA OP  Place 1 drop into both eyes daily as  needed (for dry eyes).     traMADol 50 MG tablet  Commonly known as:  ULTRAM  Take 50 mg by mouth 3 (three) times daily as needed for moderate pain.     vitamin C 500 MG tablet  Commonly known as:  ASCORBIC ACID  Take 500 mg by mouth daily.        Diagnostic Studies: Dg Chest 2 View  05/09/2014   CLINICAL DATA:  Preoperative knee arthroplasty ; gastroesophageal reflux disease  EXAM: CHEST  2 VIEW  COMPARISON:  None.  FINDINGS: The lungs are clear. The heart size and pulmonary vascularity are normal. No adenopathy. No bone lesions.  IMPRESSION: No abnormality noted.   Electronically Signed   By: Bretta Bang M.D.   On: 05/09/2014 13:50    Disposition: home with home health PT      Discharge Instructions   CPM    Complete by:  As directed   Continuous passive motion machine (CPM):      Use the CPM from 0  Degrees to 70 degrees for 6 hours per day.      You may increase by 10 degrees per day.  You may break it up into 2 or 3 sessions per day.      Use CPM for 1-2 weeks or until you are told to stop.     Call MD / Call 911    Complete by:  As directed   If you experience chest pain or shortness of breath, CALL 911 and be transported to the hospital emergency room.  If you develope a fever above 101 F, pus (white drainage) or increased drainage or redness at the wound, or calf pain, call your surgeon's office.     Constipation Prevention    Complete by:  As directed   Drink plenty of fluids.  Prune juice may be helpful.  You may use a stool softener, such as Colace (over the counter) 100 mg twice a day.  Use MiraLax (over the counter) for constipation as needed.     Diet general    Complete by:  As directed      Do not put a pillow under the knee. Place it under the heel.    Complete by:  As directed  Use your footsie roll Under your heel that you used in the hospital.     Increase activity slowly as tolerated    Complete by:  As directed      Weight bearing as tolerated     Complete by:  As directed   Laterality:  left  Extremity:  Lower           Follow-up Information   Follow up with Gerod Caligiuri G, PA-C. Schedule an appointment as soon as possible for a visit in 2 weeks. Rosanne Ashing is Dr Darene Lamer PA)    Specialty:  Orthopedic Surgery   Contact information:   [facility] [address] [place] [address] [phone]       Follow up with [facility]. (They will contact you to schedule home physical therapy.)    Contact information:   [address] [place] [address] [phone]        Signed: Matthew Folks 05/15/2014, 2:35 PM

## 2014-05-15 NOTE — Evaluation (Signed)
Physical Therapy Evaluation Patient Details Name: Robert Mcmillan MRN: [id number] DOB: 03-23-1961 Today's Date: 05/15/2014   History of Present Illness  pt presents with L TKA.    Clinical Impression  Pt moving great this am.  Pt able to demo good strength and safety with mobility.  Feel pt will make great progress and be ready for D/C soon from PT stand point.  Will continue to follow.      Follow Up Recommendations Home health PT;Supervision - Intermittent    Equipment Recommendations  Rolling walker with 5" wheels;3in1 (PT)    Recommendations for Other Services       Precautions / Restrictions Precautions Precautions: None Restrictions Weight Bearing Restrictions: Yes LLE Weight Bearing: Weight bearing as tolerated      Mobility  Bed Mobility Overal bed mobility: Needs Assistance Bed Mobility: Supine to Sit     Supine to sit: Supervision     General bed mobility comments: cues for encouragement.  No physical A needed.    Transfers Overall transfer level: Needs assistance Equipment used: Rolling walker (2 wheeled) Transfers: Sit to/from Stand Sit to Stand: Supervision         General transfer comment: cues for UE use and getting closer prior to sitting.    Ambulation/Gait Ambulation/Gait assistance: Supervision Ambulation Distance (Feet): 350 Feet Assistive device: Rolling walker (2 wheeled) Gait Pattern/deviations: Step-through pattern;Decreased stride length;Trunk flexed     General Gait Details: pt moving well.  cueing for upright posture and gait sequencing.  cues for increased WBing on L LE and more fluid gait pattern.    Stairs            Wheelchair Mobility    Modified Rankin (Stroke Patients Only)       Balance Overall balance assessment: No apparent balance deficits (not formally assessed)                                           Pertinent Vitals/Pain Pain Assessment: 0-10 Pain Score: 3  Pain Location: L  knee Pain Descriptors / Indicators: Aching Pain Intervention(s): Premedicated before session;Repositioned    Home Living Family/patient expects to be discharged to:: Private residence Living Arrangements: Alone Available Help at Discharge: Family;Available 24 hours/day (Daughter to stay with pt.) Type of Home: Apartment Home Access: Level entry     Home Layout: One level Home Equipment: None      Prior Function Level of Independence: Independent               Hand Dominance        Extremity/Trunk Assessment   Upper Extremity Assessment: Overall WFL for tasks assessed           Lower Extremity Assessment: LLE deficits/detail   LLE Deficits / Details: AROM ~ 10 - 75  Cervical / Trunk Assessment: Normal  Communication   Communication: No difficulties  Cognition Arousal/Alertness: Awake/alert Behavior During Therapy: WFL for tasks assessed/performed Overall Cognitive Status: Within Functional Limits for tasks assessed                      General Comments      Exercises Total Joint Exercises Ankle Circles/Pumps: AROM;Both;10 reps Quad Sets: AROM;Both;10 reps Heel Slides: AAROM;Left;10 reps Straight Leg Raises: AROM;Left;10 reps Long Arc Quad: AROM;Left;10 reps Knee Flexion: AROM;Left;10 reps      Assessment/Plan    PT Assessment Patient  needs continued PT services  PT Diagnosis Abnormality of gait   PT Problem List Decreased strength;Decreased range of motion;Decreased activity tolerance;Decreased balance;Decreased mobility;Decreased knowledge of use of DME  PT Treatment Interventions DME instruction;Gait training;Stair training;Functional mobility training;Therapeutic activities;Therapeutic exercise;Balance training;Patient/family education   PT Goals (Current goals can be found in the Care Plan section) Acute Rehab PT Goals Patient Stated Goal: Walk better PT Goal Formulation: With patient Time For Goal Achievement: 05/22/14 Potential  to Achieve Goals: Good    Frequency 7X/week   Barriers to discharge        Co-evaluation               End of Session Equipment Utilized During Treatment: Gait belt Activity Tolerance: Patient tolerated treatment well Patient left: in chair;with call bell/phone within reach Nurse Communication: Mobility status         Time: 0920-0954 PT Time Calculation (min): 34 min   Charges:   PT Evaluation $Initial PT Evaluation Tier I: 1 Procedure PT Treatments $Gait Training: 8-22 mins $Therapeutic Exercise: 8-22 mins   PT G CodesSunny Schlein, [place] [phone] 05/15/2014, 10:47 AM

## 2014-05-15 NOTE — Anesthesia Postprocedure Evaluation (Signed)
  Anesthesia Post-op Note  Patient: Robert Mcmillan  Procedure(s) Performed: Procedure(s) with comments: TOTAL KNEE ARTHROPLASTY (Left) - Left total knee arthroplasty  Patient Location: PACU  Anesthesia Type:General and GA combined with regional for post-op pain  Level of Consciousness: awake, alert , oriented and patient cooperative  Airway and Oxygen Therapy: Patient Spontanous Breathing  Post-op Pain: mild  Post-op Assessment: Post-op Vital signs reviewed, Patient's Cardiovascular Status Stable, Respiratory Function Stable, Patent Airway and No signs of Nausea or vomiting  Post-op Vital Signs: stable  Last Vitals:  Filed Vitals:   05/15/14 0415  BP: 114/51  Pulse: 99  Temp: 36.9 C  Resp: 16    Complications: No apparent anesthesia complications

## 2014-05-15 NOTE — Progress Notes (Signed)
Utilization review completed.  

## 2014-05-15 NOTE — Progress Notes (Signed)
Subjective: 1 Day Post-Op Procedure(s) (LRB): TOTAL KNEE ARTHROPLASTY (Left) Patient reports pain as mild.  Patient is taking by mouth and voiding okay. Getting up with physical therapy  Objective: Vital signs in last 24 hours: Temp:  [97.7 F (36.5 C)-99.3 F (37.4 C)] 98.5 F (36.9 C) (09/15 0415) Pulse Rate:  [61-99] 99 (09/15 0415) Resp:  [12-24] 16 (09/15 0415) BP: (107-154)/(51-86) 114/51 mmHg (09/15 0415) SpO2:  [90 %-100 %] 99 % (09/15 0415) Weight:  [90.266 kg (199 lb)] 90.266 kg (199 lb) (09/14 1249)  Intake/Output from previous day: 09/14 0701 - 09/15 0700 In: 1745 [I.V.:1695; IV Piggyback:50] Out: 1625 [Urine:900; Drains:725] Intake/Output this shift:     Recent Labs  05/15/14 0437  HGB 9.8*    Recent Labs  05/15/14 0437  WBC 4.7  RBC 3.49*  HCT 28.6*  PLT 162    Recent Labs  05/15/14 0437  NA 141  K 4.0  CL 104  CO2 26  BUN 14  CREATININE 1.14  GLUCOSE 109*  CALCIUM 8.1*   Left knee exam: Neurovascular intact Sensation intact distally Intact pulses distally Dorsiflexion/Plantar flexion intact Incision: dressing C/D/I Compartment soft  Assessment/Plan: 1 Day Post-Op Procedure(s) (LRB): TOTAL KNEE ARTHROPLASTY (Left) Plan: Hemovac drain pulled. Up with therapy Discharge home with home health if does well with physical therapy today. We will check with the patient this afternoon and consider discharge either today or in the a.m.  Elfie Costanza G 05/15/2014, 9:35 AM

## 2014-05-15 NOTE — Plan of Care (Signed)
Problem: Consults Goal: Diagnosis- Total Joint Replacement Primary Total Knee Right     

## 2014-05-15 NOTE — Progress Notes (Signed)
Robert Mcmillan to be D/C'd Home per MD order. Discussed with the patient and all questions fully answered.    Medication List    STOP taking these medications       naproxen 500 MG tablet  Commonly known as:  NAPROSYN      TAKE these medications       aspirin EC 325 MG tablet  Take 1 tablet (325 mg total) by mouth 2 (two) times daily after a meal.     methocarbamol 750 MG tablet  Commonly known as:  ROBAXIN-750  Take 1 tablet (750 mg total) by mouth every 8 (eight) hours as needed for muscle spasms.     nortriptyline 50 MG capsule  Commonly known as:  PAMELOR  Take 100 mg by mouth 2 (two) times daily.     OVER THE COUNTER MEDICATION  Take 1 capsule by mouth every other day. Maximum Shred     oxyCODONE-acetaminophen 5-325 MG per tablet  Commonly known as:  PERCOCET/ROXICET  Take 1-2 tablets by mouth every 6 (six) hours as needed for severe pain.     SUMAtriptan 6 MG/0.5ML Soln injection  Commonly known as:  IMITREX  Inject 6 mg into the skin every 2 (two) hours as needed for migraine or headache. May repeat in 2 hours if headache persists or recurs.     SYSTANE ULTRA OP  Place 1 drop into both eyes daily as needed (for dry eyes).     traMADol 50 MG tablet  Commonly known as:  ULTRAM  Take 50 mg by mouth 3 (three) times daily as needed for moderate pain.     vitamin C 500 MG tablet  Commonly known as:  ASCORBIC ACID  Take 500 mg by mouth daily.        VVS, Skin clean, dry and intact without evidence of skin break down, no evidence of skin tears noted.  IV catheter discontinued intact. Site without signs and symptoms of complications. Dressing and pressure applied.  An After Visit Summary was printed and given to the patient.  Patient escorted via WC, and D/C home via private auto.  [name]  05/15/2014 3:47 PM

## 2014-05-17 ENCOUNTER — Encounter (HOSPITAL_COMMUNITY): Payer: Self-pay | Admitting: Orthopedic Surgery

## 2014-06-06 ENCOUNTER — Ambulatory Visit: Payer: Medicare Other | Attending: Orthopedic Surgery | Admitting: Physical Therapy

## 2014-06-06 DIAGNOSIS — Z4733 Aftercare following explantation of knee joint prosthesis: Secondary | ICD-10-CM | POA: Insufficient documentation

## 2014-06-06 DIAGNOSIS — M2560 Stiffness of unspecified joint, not elsewhere classified: Secondary | ICD-10-CM | POA: Diagnosis not present

## 2014-06-06 DIAGNOSIS — R609 Edema, unspecified: Secondary | ICD-10-CM | POA: Insufficient documentation

## 2014-06-06 DIAGNOSIS — M25562 Pain in left knee: Secondary | ICD-10-CM | POA: Insufficient documentation

## 2015-02-07 IMAGING — CR DG CHEST 2V
2 series · 2 of 2 positions shown · non-contrast
Comparison: None.

CLINICAL DATA: Preoperative knee arthroplasty ; gastroesophageal
reflux disease

EXAM:
CHEST  2 VIEW

[w chest pa]
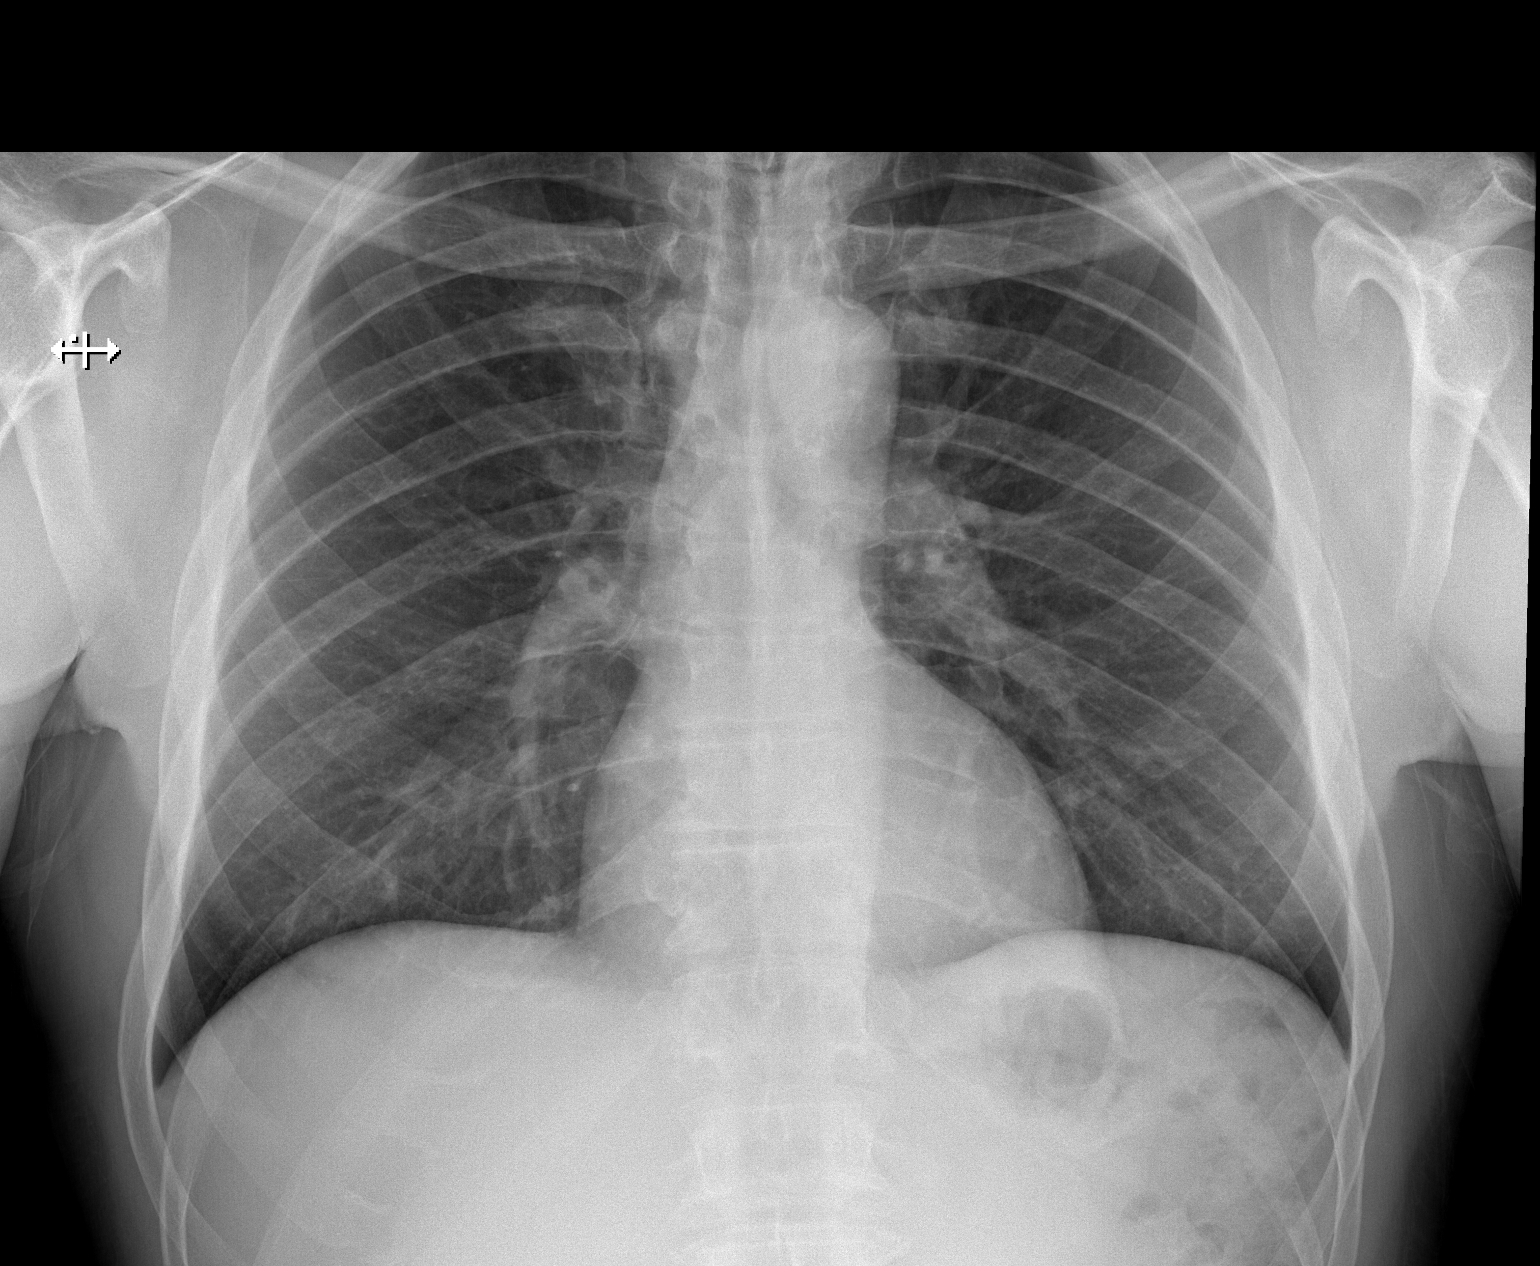

[w chest lat]
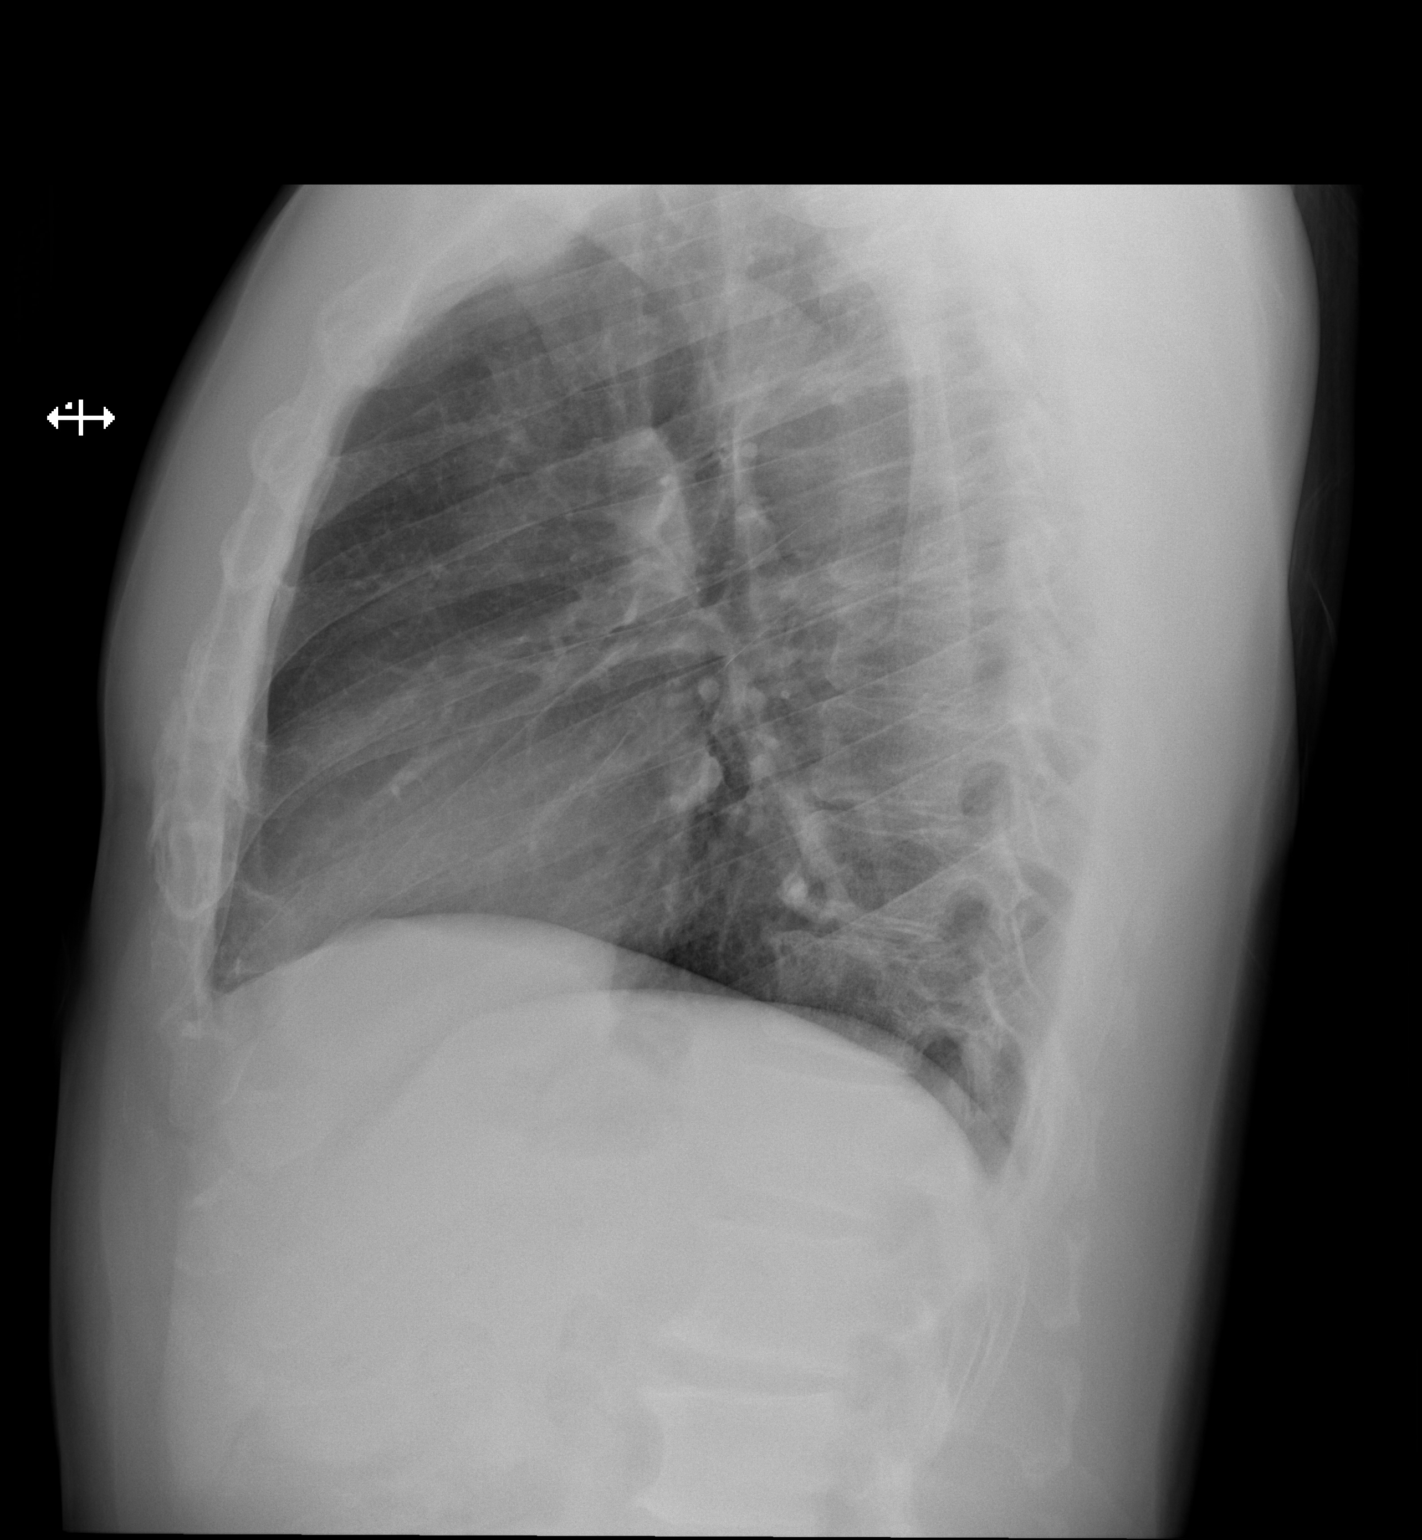

[2 of 2 positions shown; findings below may reference images not displayed]

FINDINGS: The lungs are clear. The heart size and pulmonary vascularity are
normal. No adenopathy. No bone lesions.
IMPRESSION: No abnormality noted.
